# Patient Record
Sex: Male | Born: 1990 | Race: White | Hispanic: No | Marital: Married | Smoking: Former smoker
Health system: Southern US, Community
[De-identification: ages and names within clinical notes are randomized; demographics above are authoritative.]

## PROBLEM LIST (undated history)

## (undated) DIAGNOSIS — J45909 Unspecified asthma, uncomplicated: Secondary | ICD-10-CM

---
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
## 2014-04-15 ENCOUNTER — Ambulatory Visit: Payer: Self-pay | Admitting: General Practice

## 2015-06-29 ENCOUNTER — Ambulatory Visit
Admission: RE | Admit: 2015-06-29 | Discharge: 2015-06-29 | Disposition: A | Discharge status: Home / Self Care | Payer: PRIVATE HEALTH INSURANCE | Source: Ambulatory Visit | Attending: Family Medicine | Admitting: Family Medicine

## 2015-06-29 ENCOUNTER — Other Ambulatory Visit: Payer: Self-pay | Admitting: Family Medicine

## 2015-06-29 DIAGNOSIS — Z09 Encounter for follow-up examination after completed treatment for conditions other than malignant neoplasm: Secondary | ICD-10-CM

## 2015-06-29 DIAGNOSIS — Z569 Unspecified problems related to employment: Secondary | ICD-10-CM | POA: Diagnosis present

## 2015-10-18 ENCOUNTER — Emergency Department
Admission: EM | Admit: 2015-10-18 | Discharge: 2015-10-18 | Disposition: A | Discharge status: Home / Self Care | Payer: Worker's Compensation | Attending: Emergency Medicine | Admitting: Emergency Medicine

## 2015-10-18 ENCOUNTER — Encounter: Payer: Self-pay | Admitting: Emergency Medicine

## 2015-10-18 ENCOUNTER — Emergency Department: Payer: Worker's Compensation

## 2015-10-18 DIAGNOSIS — Y990 Civilian activity done for income or pay: Secondary | ICD-10-CM | POA: Insufficient documentation

## 2015-10-18 DIAGNOSIS — Y929 Unspecified place or not applicable: Secondary | ICD-10-CM | POA: Insufficient documentation

## 2015-10-18 DIAGNOSIS — S20212A Contusion of left front wall of thorax, initial encounter: Secondary | ICD-10-CM

## 2015-10-18 DIAGNOSIS — J45909 Unspecified asthma, uncomplicated: Secondary | ICD-10-CM | POA: Diagnosis not present

## 2015-10-18 DIAGNOSIS — W1839XA Other fall on same level, initial encounter: Secondary | ICD-10-CM | POA: Diagnosis not present

## 2015-10-18 DIAGNOSIS — Y9389 Activity, other specified: Secondary | ICD-10-CM | POA: Insufficient documentation

## 2015-10-18 DIAGNOSIS — S8012XA Contusion of left lower leg, initial encounter: Secondary | ICD-10-CM | POA: Diagnosis not present

## 2015-10-18 DIAGNOSIS — F17210 Nicotine dependence, cigarettes, uncomplicated: Secondary | ICD-10-CM | POA: Diagnosis not present

## 2015-10-18 DIAGNOSIS — S8992XA Unspecified injury of left lower leg, initial encounter: Secondary | ICD-10-CM | POA: Diagnosis present

## 2015-10-18 HISTORY — DX: Unspecified asthma, uncomplicated: J45.909

## 2015-10-18 MED ORDER — IBUPROFEN 800 MG PO TABS: 800.0000 mg | ORAL_TABLET | Freq: Three times a day (TID) | ORAL | Status: AC | PRN

## 2015-10-18 MED ORDER — CYCLOBENZAPRINE HCL 5 MG PO TABS: 5.0000 mg | ORAL_TABLET | Freq: Three times a day (TID) | ORAL | Status: AC | PRN

## 2015-10-18 NOTE — Discharge Instructions (Signed)
Contusion A contusion is a deep bruise. Contusions are the result of a blunt injury to tissues and muscle fibers under the skin. The injury causes bleeding under the skin. The skin overlying the contusion may turn blue, purple, or yellow. Minor injuries will give you a painless contusion, but more severe contusions may stay painful and swollen for a few weeks.  CAUSES  This condition is usually caused by a blow, trauma, or direct force to an area of the body. SYMPTOMS  Symptoms of this condition include:  Swelling of the injured area.  Pain and tenderness in the injured area.  Discoloration. The area may have redness and then turn blue, purple, or yellow. DIAGNOSIS  This condition is diagnosed based on a physical exam and medical history. An X-ray, CT scan, or MRI may be needed to determine if there are any associated injuries, such as broken bones (fractures). TREATMENT  Specific treatment for this condition depends on what area of the body was injured. In general, the best treatment for a contusion is resting, icing, applying pressure to (compression), and elevating the injured area. This is often called the RICE strategy. Over-the-counter anti-inflammatory medicines may also be recommended for pain control.  HOME CARE INSTRUCTIONS   Rest the injured area.  If directed, apply ice to the injured area:  Put ice in a plastic bag.  Place a towel between your skin and the bag.  Leave the ice on for 20 minutes, 2-3 times per day.  If directed, apply light compression to the injured area using an elastic bandage. Make sure the bandage is not wrapped too tightly. Remove and reapply the bandage as directed by your health care provider.  If possible, raise (elevate) the injured area above the level of your heart while you are sitting or lying down.  Take over-the-counter and prescription medicines only as told by your health care provider. SEEK MEDICAL CARE IF:  Your symptoms do not  improve after several days of treatment.  Your symptoms get worse.  You have difficulty moving the injured area. SEEK IMMEDIATE MEDICAL CARE IF:   You have severe pain.  You have numbness in a hand or foot.  Your hand or foot turns pale or cold.   This information is not intended to replace advice given to you by your health care provider. Make sure you discuss any questions you have with your health care provider.   Document Released: 02/16/2005 Document Revised: 01/28/2015 Document Reviewed: 09/24/2014 Elsevier Interactive Patient Education 2016 Elsevier Inc.  Rib Contusion A rib contusion is a deep bruise on your rib area. Contusions are the result of a blunt trauma that causes bleeding and injury to the tissues under the skin. A rib contusion may involve bruising of the ribs and of the skin and muscles in the area. The skin overlying the contusion may turn blue, purple, or yellow. Minor injuries will give you a painless contusion, but more severe contusions may stay painful and swollen for a few weeks. CAUSES  A contusion is usually caused by a blow, trauma, or direct force to an area of the body. This often occurs while playing contact sports. SYMPTOMS  Swelling and redness of the injured area.  Discoloration of the injured area.  Tenderness and soreness of the injured area.  Pain with or without movement. DIAGNOSIS  The diagnosis can be made by taking a medical history and performing a physical exam. An X-ray, CT scan, or MRI may be needed to determine if  there were any associated injuries, such as broken bones (fractures) or internal injuries. TREATMENT  Often, the best treatment for a rib contusion is rest. Icing or applying cold compresses to the injured area may help reduce swelling and inflammation. Deep breathing exercises may be recommended to reduce the risk of partial lung collapse and pneumonia. Over-the-counter or prescription medicines may also be recommended for  pain control. HOME CARE INSTRUCTIONS   Apply ice to the injured area:  Put ice in a plastic bag.  Place a towel between your skin and the bag.  Leave the ice on for 20 minutes, 2-3 times per day.  Take medicines only as directed by your health care provider.  Rest the injured area. Avoid strenuous activity and any activities or movements that cause pain. Be careful during activities and avoid bumping the injured area.  Perform deep-breathing exercises as directed by your health care provider.  Do not lift anything that is heavier than 5 lb (2.3 kg) until your health care provider approves.  Do not use any tobacco products, including cigarettes, chewing tobacco, or electronic cigarettes. If you need help quitting, ask your health care provider. SEEK MEDICAL CARE IF:   You have increased bruising or swelling.  You have pain that is not controlled with treatment.  You have a fever. SEEK IMMEDIATE MEDICAL CARE IF:   You have difficulty breathing or shortness of breath.  You develop a continual cough, or you cough up thick or bloody sputum.  You feel sick to your stomach (nauseous), you throw up (vomit), or you have abdominal pain.   This information is not intended to replace advice given to you by your health care provider. Make sure you discuss any questions you have with your health care provider.   Document Released: 02/01/2001 Document Revised: 05/30/2014 Document Reviewed: 02/18/2014 Elsevier Interactive Patient Education [employer] Elsevier Inc.

## 2015-10-18 NOTE — ED Provider Notes (Signed)
[facility] Emergency Department Provider Note  ____________________________________________  Time seen: Approximately 3:19 PM  I have reviewed the triage vital signs and the nursing notes.   HISTORY  Chief Complaint Work Related Injury and Leg Pain    HPI [place] D Sagan is a 25 y.o. male presents for evaluation of left leg and left side pain. Patient states a "60 pound mold" fell on him at work. Patient was sent over by his work for evaluation. No relief with Tylenol or ibuprofen over-the-counter. He is concerned about the bruising on both his ribs and his leg. Describes pain as 6/10 at this time.   Past Medical History  Diagnosis Date  . Asthma     There are no active problems to display for this patient.   History reviewed. No pertinent past surgical history.  Current Outpatient Rx  Name  Route  Sig  Dispense  Refill  . cyclobenzaprine (FLEXERIL) 5 MG tablet   Oral   Take 1 tablet (5 mg total) by mouth every 8 (eight) hours as needed for muscle spasms.   30 tablet   0   . ibuprofen (ADVIL,MOTRIN) 800 MG tablet   Oral   Take 1 tablet (800 mg total) by mouth every 8 (eight) hours as needed.   30 tablet   0     Allergies Review of patient's allergies indicates no known allergies.  No family history on file.  Social History Social History  Substance Use Topics  . Smoking status: Current Every Day Smoker    Types: Cigarettes  . Smokeless tobacco: None  . Alcohol Use: Yes     Comment: occasionally    Review of Systems Constitutional: No fever/chills Cardiovascular: Denies chest pain. Respiratory: Denies shortness of breath. Gastrointestinal: No abdominal pain.  No nausea, no vomiting.  No diarrhea.  No constipation. Musculoskeletal: Positive for left leg pain. Positive for left rib pain. Skin: Negative for rash. Neurological: Negative for headaches, focal weakness or numbness.  10-point ROS otherwise  negative.  ____________________________________________   PHYSICAL EXAM:  VITAL SIGNS: ED Triage Vitals  Enc Vitals Group     BP 10/18/15 1458 106/65 mmHg     Pulse Rate 10/18/15 1458 79     Resp 10/18/15 1458 18     Temp 10/18/15 1458 98.3 F (36.8 C)     Temp Source 10/18/15 1458 Oral     SpO2 10/18/15 1458 98 %     Weight 10/18/15 1458 150 lb (68.04 kg)     Height 10/18/15 1458 5\' 8"  (1.727 m)     Head Cir --      Peak Flow --      Pain Score 10/18/15 1459 6     Pain Loc --      Pain Edu? --      Excl. in GC? --     Constitutional: Alert and oriented. Well appearing and in no acute distress.   Cardiovascular: Normal rate, regular rhythm. Grossly normal heart sounds.  Good peripheral circulation. Respiratory: Normal respiratory effort.  No retractions. Lungs CTAB. Gastrointestinal: Soft and nontender. No distention. No abdominal bruits. No CVA tenderness. Musculoskeletal: Left rib cage lower lateral aspect with ecchymosis and bruising point tenderness noted. Left posterior tib-fib in the gastrocnemius muscle area with ecchymosis and bruising as well. Neurologic:  Normal speech and language. No gross focal neurologic deficits are appreciated. Ambulates with a limp. Skin:  Skin is warm, dry and intact. No rash noted. Psychiatric: Mood and affect are  normal. Speech and behavior are normal.  ____________________________________________   LABS (all labs ordered are listed, but only abnormal results are displayed)  Labs Reviewed - No data to display ____________________________________________  EKG   ____________________________________________  RADIOLOGY  No acute osseous findings ____________________________________________   PROCEDURES  Procedure(s) performed: None  Critical Care performed: No  ____________________________________________   INITIAL IMPRESSION / ASSESSMENT AND PLAN / ED COURSE  Pertinent labs & imaging results that were available during  my care of the patient were reviewed by me and considered in my medical decision making (see chart for details).  Acute left rib contusion. Acute left lower leg contusion. Reassurance provided to the patient Rx given for Flexeril 5 mg 3 times a day ibuprofen 800 mg 3 times a day. Follow-up with orthopedics primary care return to ER with any worsening symptomology. ____________________________________________   FINAL CLINICAL IMPRESSION(S) / ED DIAGNOSES  Final diagnoses:  Rib contusion, left, initial encounter  Contusion of leg, left, initial encounter     This chart was dictated using voice recognition software/Dragon. Despite best efforts to proofread, errors can occur which can change the meaning. Any change was purely unintentional.   Evangeline Dakin, PA-C 10/18/15 1605  Minna Antis, MD 10/19/15 [phone]

## 2015-10-18 NOTE — ED Notes (Addendum)
Patient presents to the ED with left leg pain and left side pain.  Patient states a 60lb "mold" fell on him at work.  Patient states he went to his job's health clinic and was instructed to come to the ED.  Patient is in no obvious distress at this time.  Patient reports pain bearing weight on left leg.

## 2015-10-18 NOTE — ED Notes (Signed)
Spoke with Haskell FlirtKiersten Fitzgerald, the owner of the patient's company, and spoke with her in regards to whether or not workers comp needs to be filed considering the date of injury was Wednesday 10/14/15.  [facility] to [facility], the patient can still file the workers comp claim but only a urine drug screen needs to be completed.  She declined the breathalyzer to be done on the patient. Penni HomansKiersten can be reached at [phone].

## 2016-05-28 ENCOUNTER — Encounter: Payer: Self-pay | Admitting: Emergency Medicine

## 2016-05-28 ENCOUNTER — Emergency Department
Admission: EM | Admit: 2016-05-28 | Discharge: 2016-05-28 | Disposition: A | Discharge status: Home / Self Care | Payer: PRIVATE HEALTH INSURANCE | Attending: Emergency Medicine | Admitting: Emergency Medicine

## 2016-05-28 DIAGNOSIS — Z87891 Personal history of nicotine dependence: Secondary | ICD-10-CM | POA: Insufficient documentation

## 2016-05-28 DIAGNOSIS — J0120 Acute ethmoidal sinusitis, unspecified: Secondary | ICD-10-CM | POA: Insufficient documentation

## 2016-05-28 DIAGNOSIS — J300 Vasomotor rhinitis: Secondary | ICD-10-CM

## 2016-05-28 MED ORDER — FLUTICASONE PROPIONATE 50 MCG/ACT NA SUSP: 2.0000 | Freq: Every day | NASAL | 0 refills | Status: AC

## 2016-05-28 MED ORDER — BENZONATATE 100 MG PO CAPS: 100.0000 mg | ORAL_CAPSULE | Freq: Three times a day (TID) | ORAL | 0 refills | Status: AC | PRN

## 2016-05-28 NOTE — Discharge Instructions (Signed)
Use the nasal steroid as directed. Start the cough medicine as needed. Consider starting and OTC allergy medicine + pseudoephedrine (Allegra-D/Claritin-D/Zyrtec-D). Use a room humidifier and nasal saline mist to moisturize your sinuses. [facility].

## 2016-05-28 NOTE — ED Triage Notes (Signed)
Sinus pressure and pain since yesterday  

## 2016-05-28 NOTE — ED Provider Notes (Signed)
[facility] Emergency Department Provider Note ____________________________________________  Time seen: 1021  I have reviewed the triage vital signs and the nursing notes.  HISTORY  Chief Complaint  Facial Pain  HPI Jeffery Delgado is a 26 y.o. male sensor the ED with a one day complaint of sinus pressure and pain. He reports his symptoms are primarily on the left side of his face around his eye. He reports some stuffy nose as well as some sinus headache. He denies any bloody nasal discharge or any history of seasonal allergies. He is taken a dose of DayQuil without any significant change in his symptoms. He denies any fevers, chills, or sweats.  Past Medical History:  Diagnosis Date  . Asthma     There are no active problems to display for this patient.   History reviewed. No pertinent surgical history.  Prior to Admission medications   Medication Sig Start Date End Date Taking? Authorizing Provider  benzonatate (TESSALON PERLES) 100 MG capsule Take 1 capsule (100 mg total) by mouth 3 (three) times daily as needed for cough (Take 1-2 per dose). 05/28/16   Quinley Nesler V Bacon Carlitos Bottino, PA-C  cyclobenzaprine (FLEXERIL) 5 MG tablet Take 1 tablet (5 mg total) by mouth every 8 (eight) hours as needed for muscle spasms. 10/18/15   Charmayne Sheer Beers, PA-C  fluticasone (FLONASE) 50 MCG/ACT nasal spray Place 2 sprays into both nostrils daily. 05/28/16   Cherie Lasalle V Bacon Laquonda Welby, PA-C  ibuprofen (ADVIL,MOTRIN) 800 MG tablet Take 1 tablet (800 mg total) by mouth every 8 (eight) hours as needed. 10/18/15   Evangeline Dakin, PA-C   Allergies Patient has no known allergies.  No family history on file.  Social History Social History  Substance Use Topics  . Smoking status: Former Smoker    Types: Cigarettes  . Smokeless tobacco: Not on file  . Alcohol use Yes     Comment: occasionally    Review of Systems  Constitutional: Negative for fever. Eyes: Negative for visual  changes. ENT: Negative for sore throat. Reports sinus pressure on the left as above. Cardiovascular: Negative for chest pain. Respiratory: Negative for shortness of breath. Neurological: Negative for headaches, focal weakness or numbness. ____________________________________________  PHYSICAL EXAM:  VITAL SIGNS: ED Triage Vitals  Enc Vitals Group     BP 05/28/16 0852 (!) 121/100     Pulse Rate 05/28/16 0852 75     Resp 05/28/16 0852 18     Temp 05/28/16 0852 97.9 F (36.6 C)     Temp Source 05/28/16 0852 Oral     SpO2 05/28/16 0852 100 %     Weight 05/28/16 0854 145 lb (65.8 kg)     Height 05/28/16 0854 5\' 7"  (1.702 m)     Head Circumference --      Peak Flow --      Pain Score 05/28/16 0854 5     Pain Loc --      Pain Edu? --      Excl. in GC? --    Constitutional: Alert and oriented. Well appearing and in no distress. Head: Normocephalic and atraumatic. Eyes: Conjunctivae are normal. PERRL. Normal extraocular movements Ears: Canals clear. TMs intact bilaterally. Nose: No congestion/rhinorrhea/epistaxis. Turbinates are enlarged slightly reddened, but moist. Mouth/Throat: Mucous membranes are moist. Lids midline and tonsils are flat. Neck: Supple. Thyroid is palpable and non-tender. Hematological/Lymphatic/Immunological: No cervical lymphadenopathy. Cardiovascular: Normal rate, regular rhythm. Normal distal pulses. Respiratory: Normal respiratory effort. No wheezes/rales/rhonchi. Skin:  Skin is  warm, dry and intact. No rash noted. ____________________________________________  INITIAL IMPRESSION / ASSESSMENT AND PLAN / ED COURSE  Patient with what appears to be in acute suspect more sinusitis with vasomotor rhinitis. No indication at this time of an acute infectious process. He will be discharged with prescriptions for Flonase, and Tessalon Perles. He is also advised on over-the-counter allergy medicine plus pseudoephedrine. He is encouraged to use saline mist clear the  sinuses and to consider these other review of eye. He should [facility] with [facility] for ongoing symptom management.  Clinical Course    ____________________________________________  FINAL CLINICAL IMPRESSION(S) / ED DIAGNOSES  Final diagnoses:  Acute non-recurrent ethmoidal sinusitis  Acute vasomotor rhinitis      Lissa HoardJenise V Bacon Elza Sortor, PA-C 05/28/16 1157    Jene Everyobert Kinner, MD 05/28/16 1218

## 2018-01-26 IMAGING — CR DG CHEST 2V
1 series · 2 of 2 positions shown · non-contrast
Comparison: 04/15/2014

CLINICAL DATA: Annual chest x-ray for industrial hygiene, fiber
exposure, health on wall S

EXAM:
CHEST  2 VIEW

[Series 1: dg chest 2 view · 0.14mm/px · 2 of 2 slices shown]
[im 1/2]
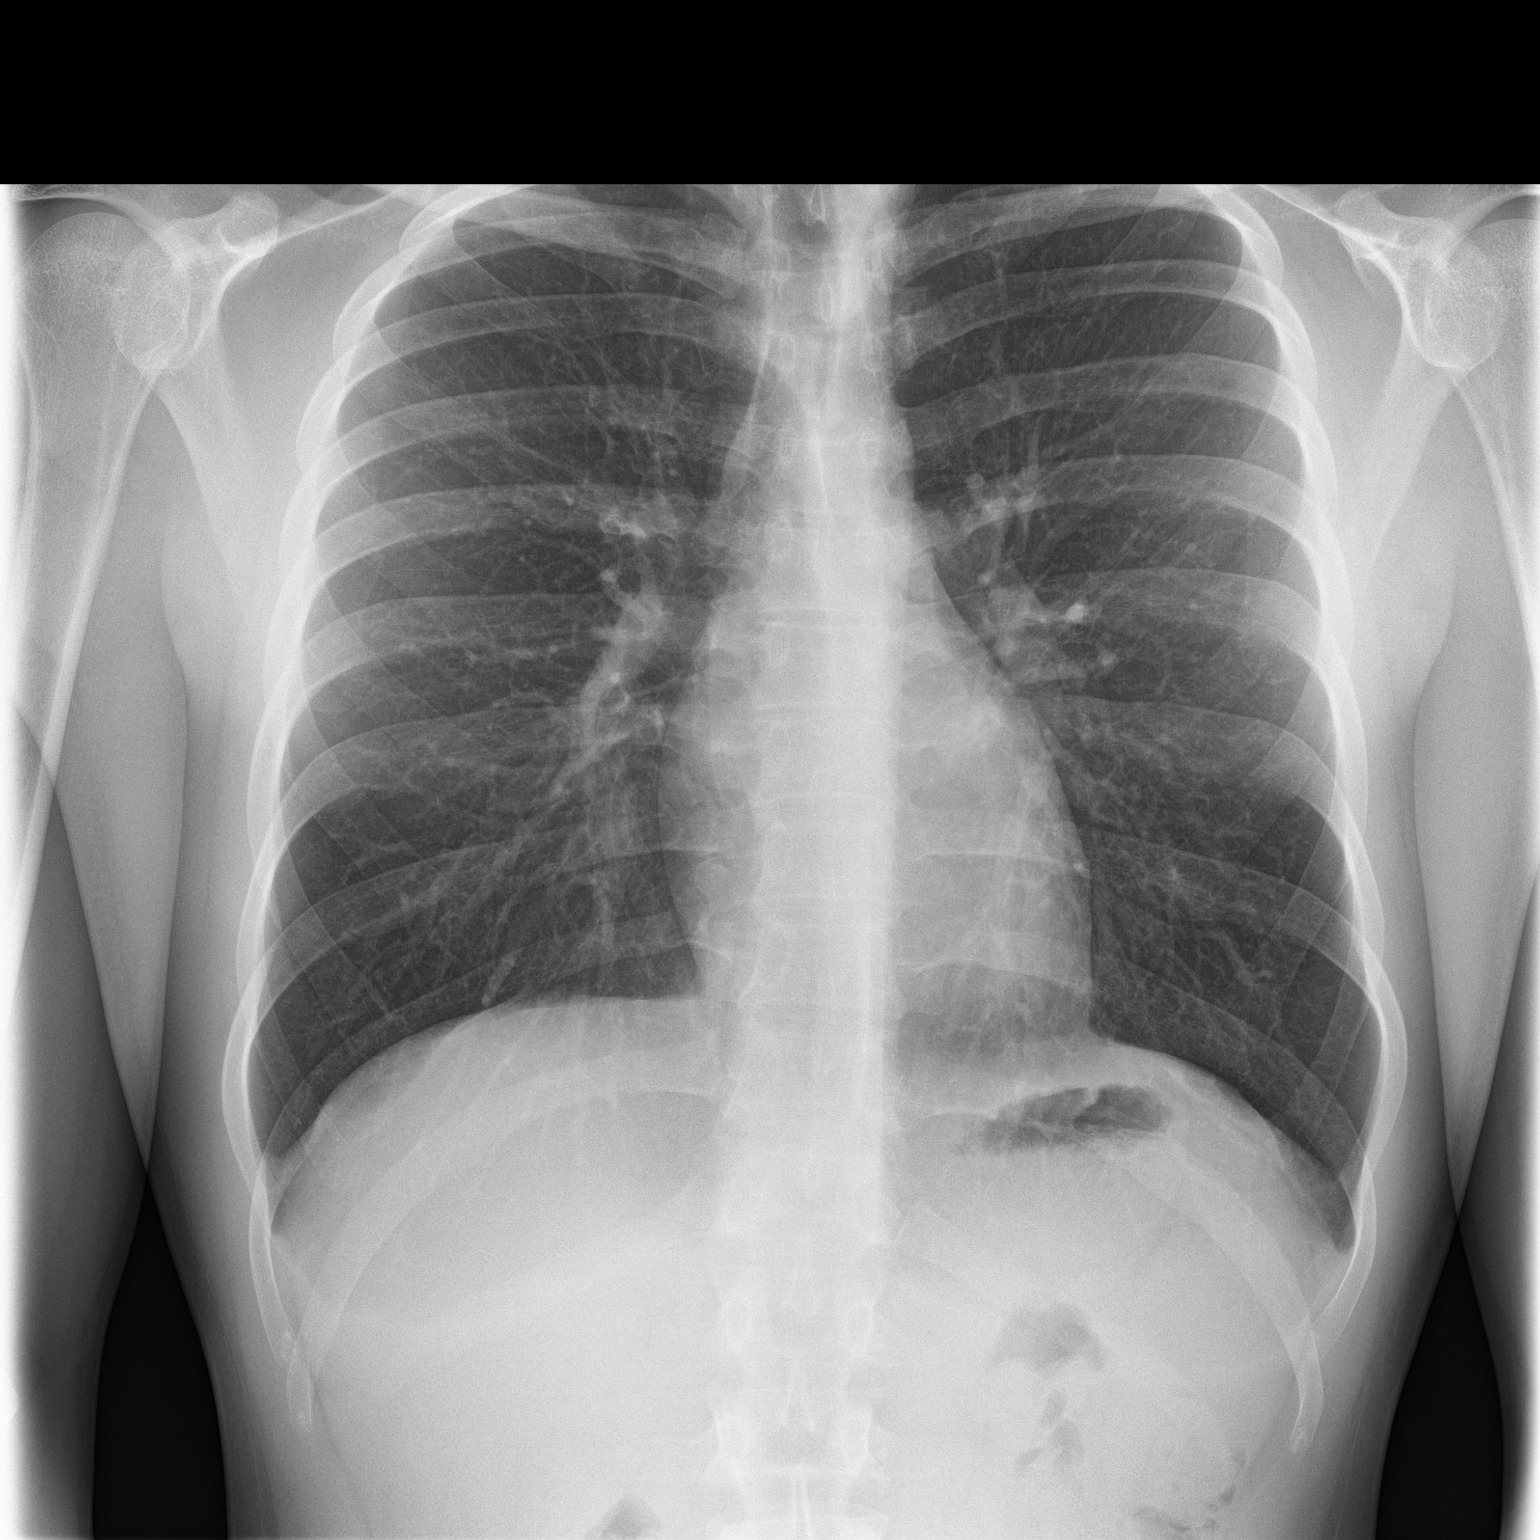
[im 2/2]
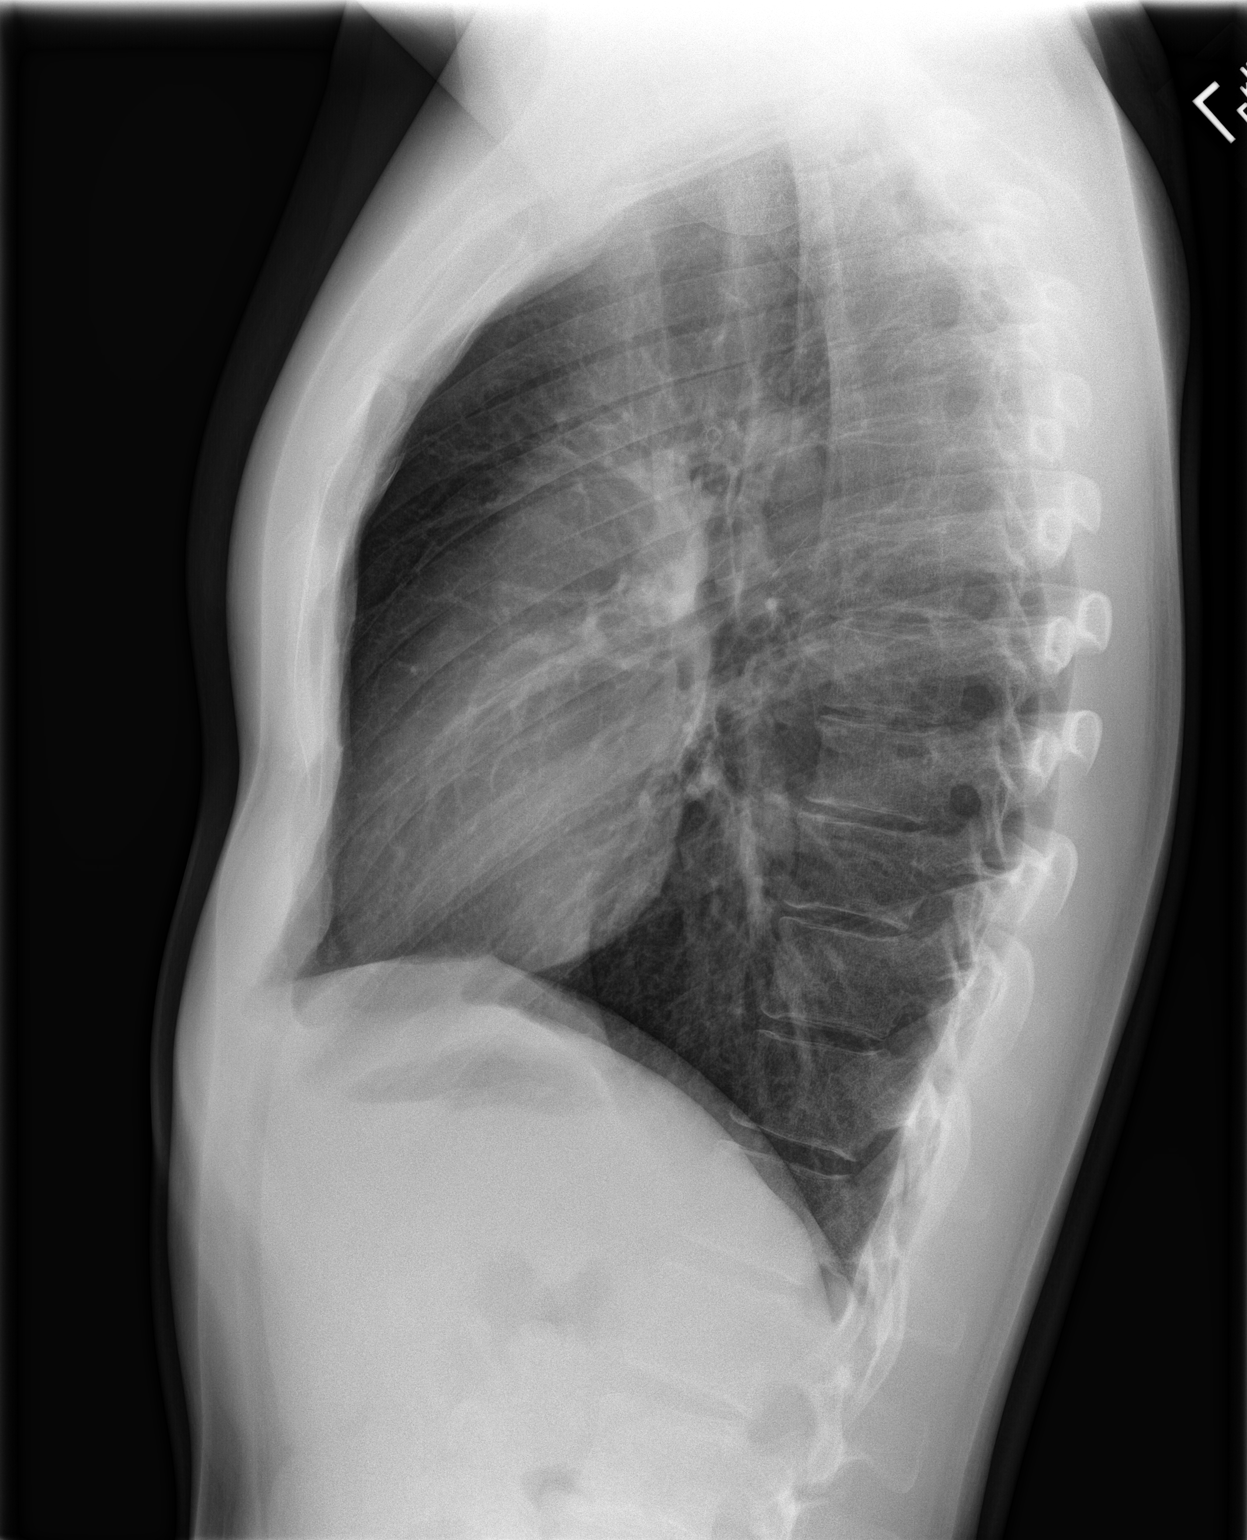

[2 of 2 positions shown; findings below may reference images not displayed]

FINDINGS: Normal heart size, mediastinal contours, and pulmonary vascularity.

Lungs minimally hyperinflated but clear.

No pleural effusion or pneumothorax.

Bones unremarkable.
IMPRESSION: No acute abnormalities.

## 2018-05-17 IMAGING — CR DG RIBS W/ CHEST 3+V*L*
1 series · 3 of 3 positions shown · non-contrast
Comparison: Chest radiograph 06/29/2015

CLINICAL DATA: Initial encounter for Pt had a 60lb object fall on
him at work on 10-14-2015. Pt is complaining of pain on left
posterior lower ribs-BB marks area of interest. Pt is also
comlaining of lower leg pain-mostly distal. Shielded. Smoker. Hx of
asthma

EXAM:
LEFT RIBS AND CHEST - 3+ VIEW

[Series 1: dg ribs unilateral w/chest left · 0.14mm/px · 3 of 3 slices shown]
[im 1/3]
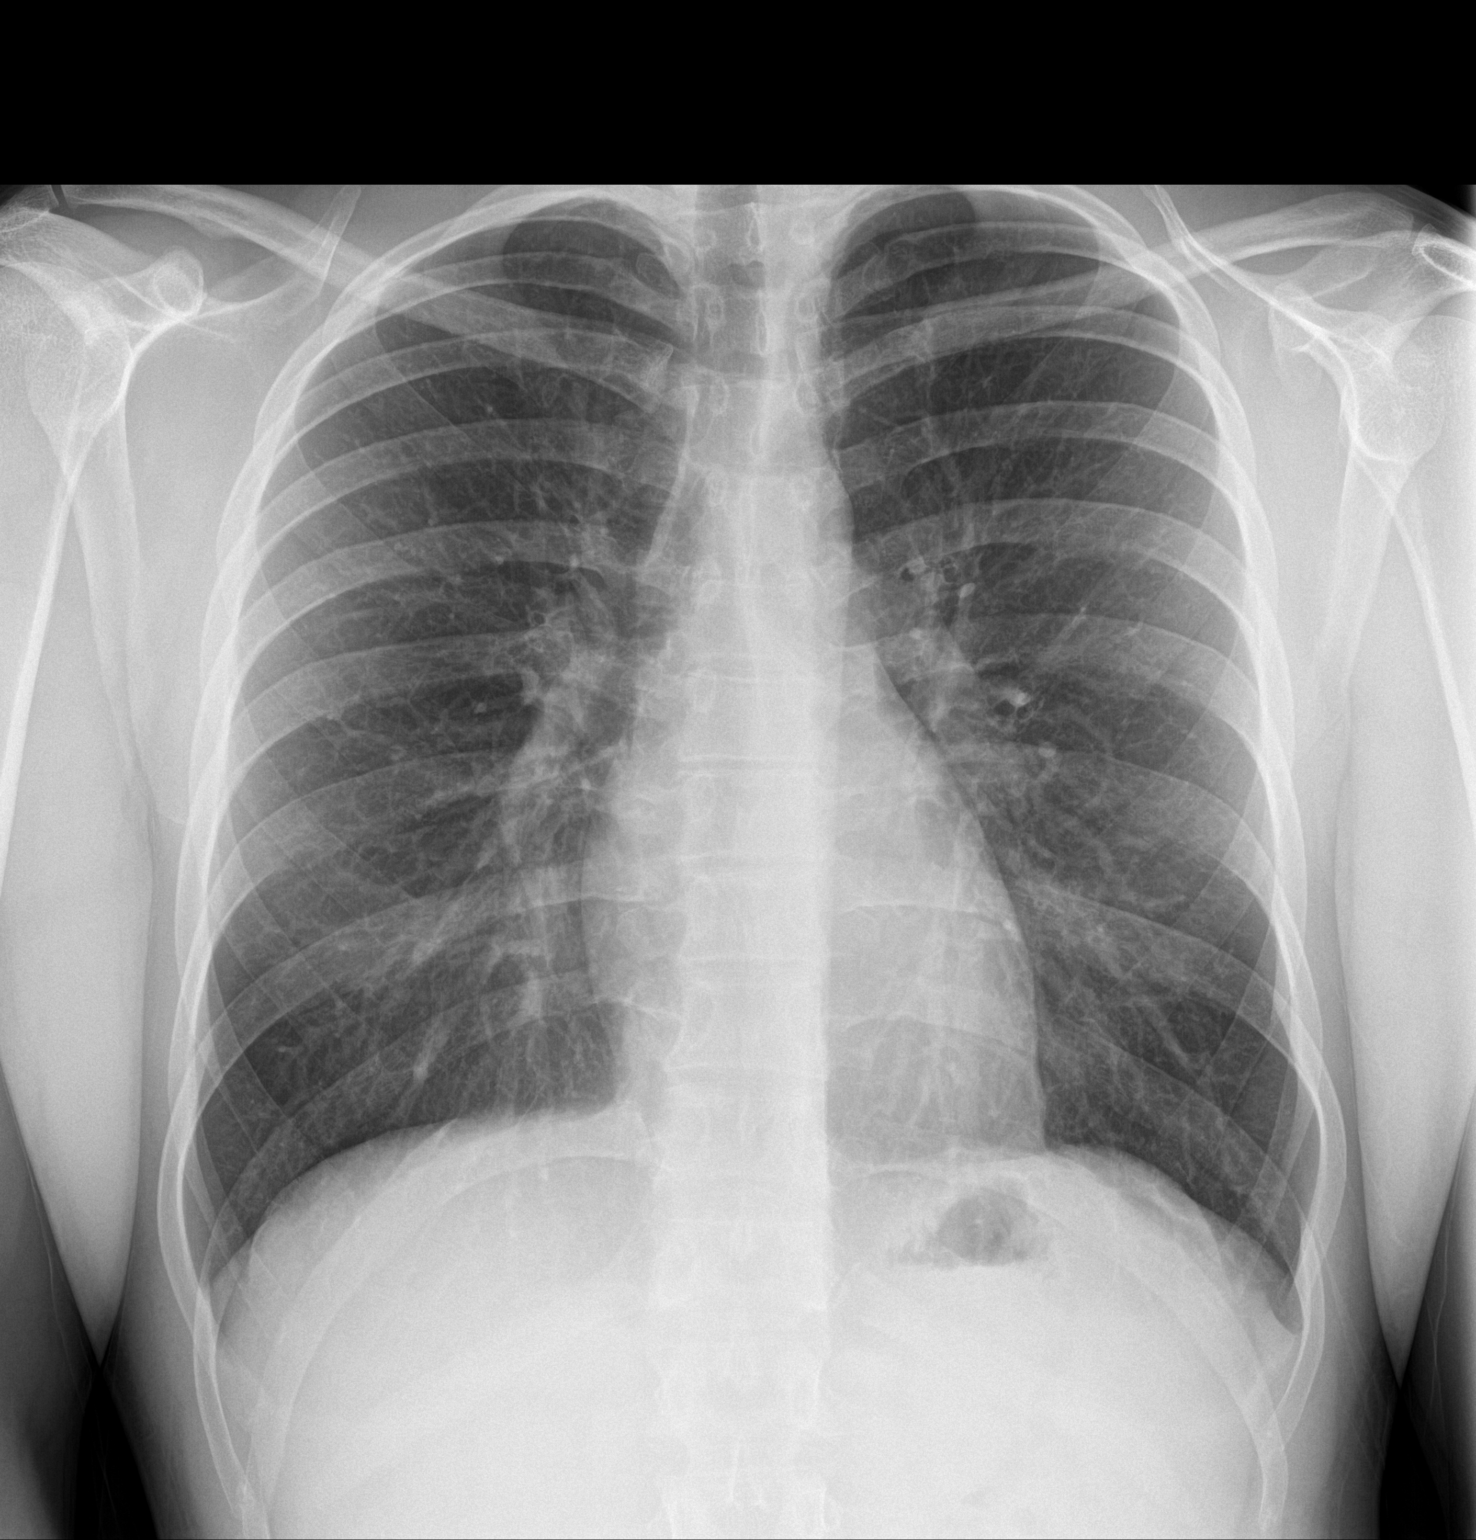
[im 2/3]
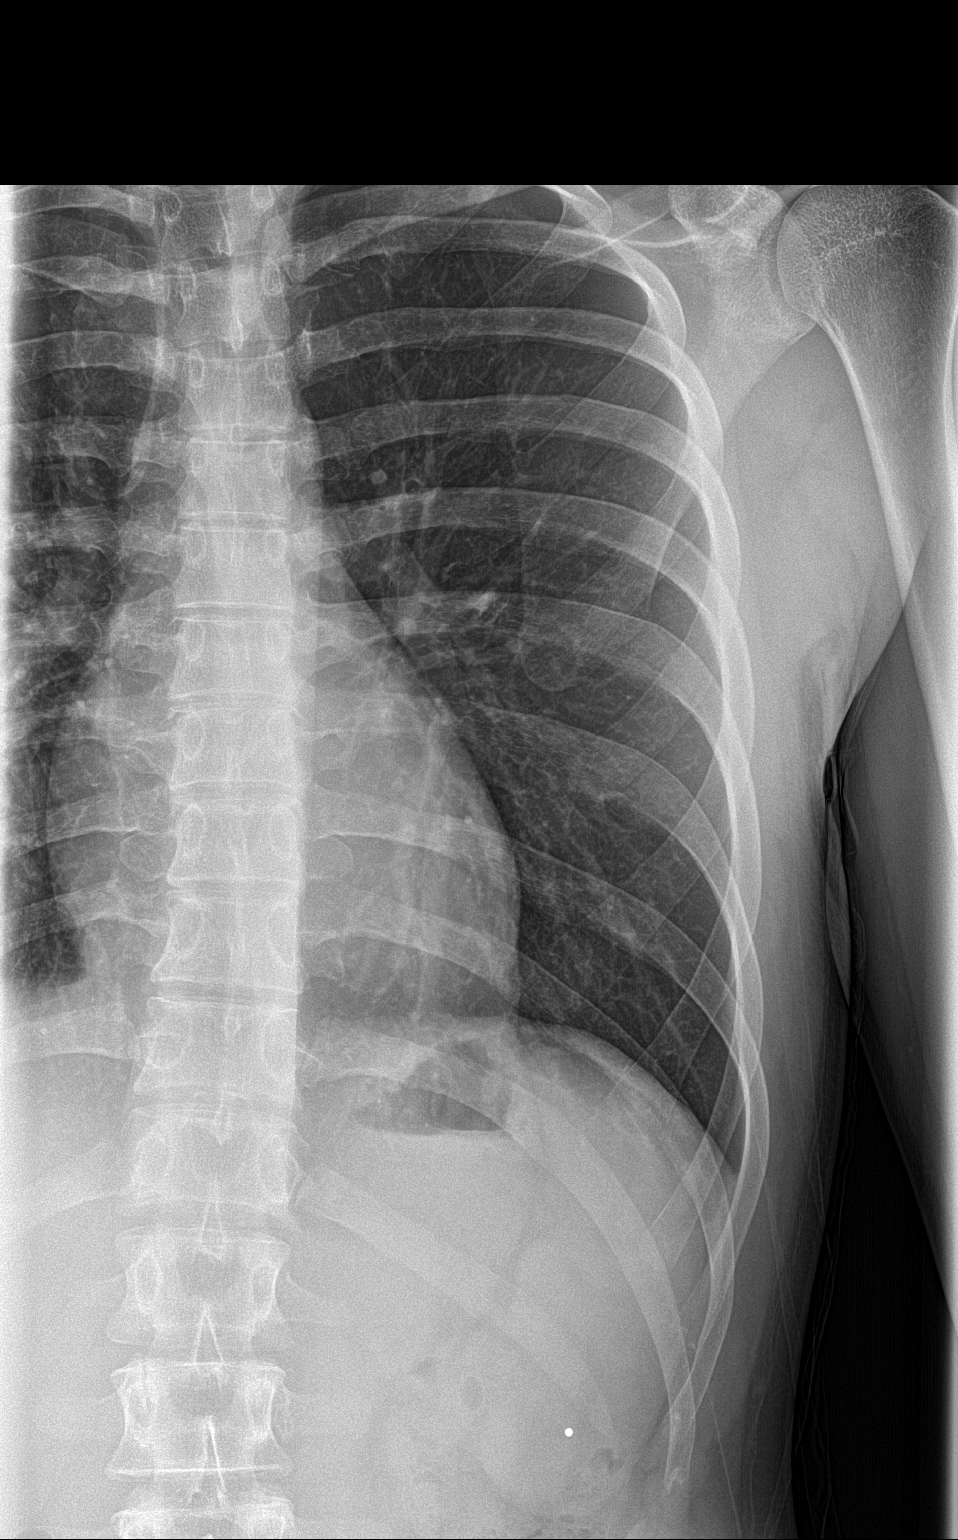
[im 3/3]
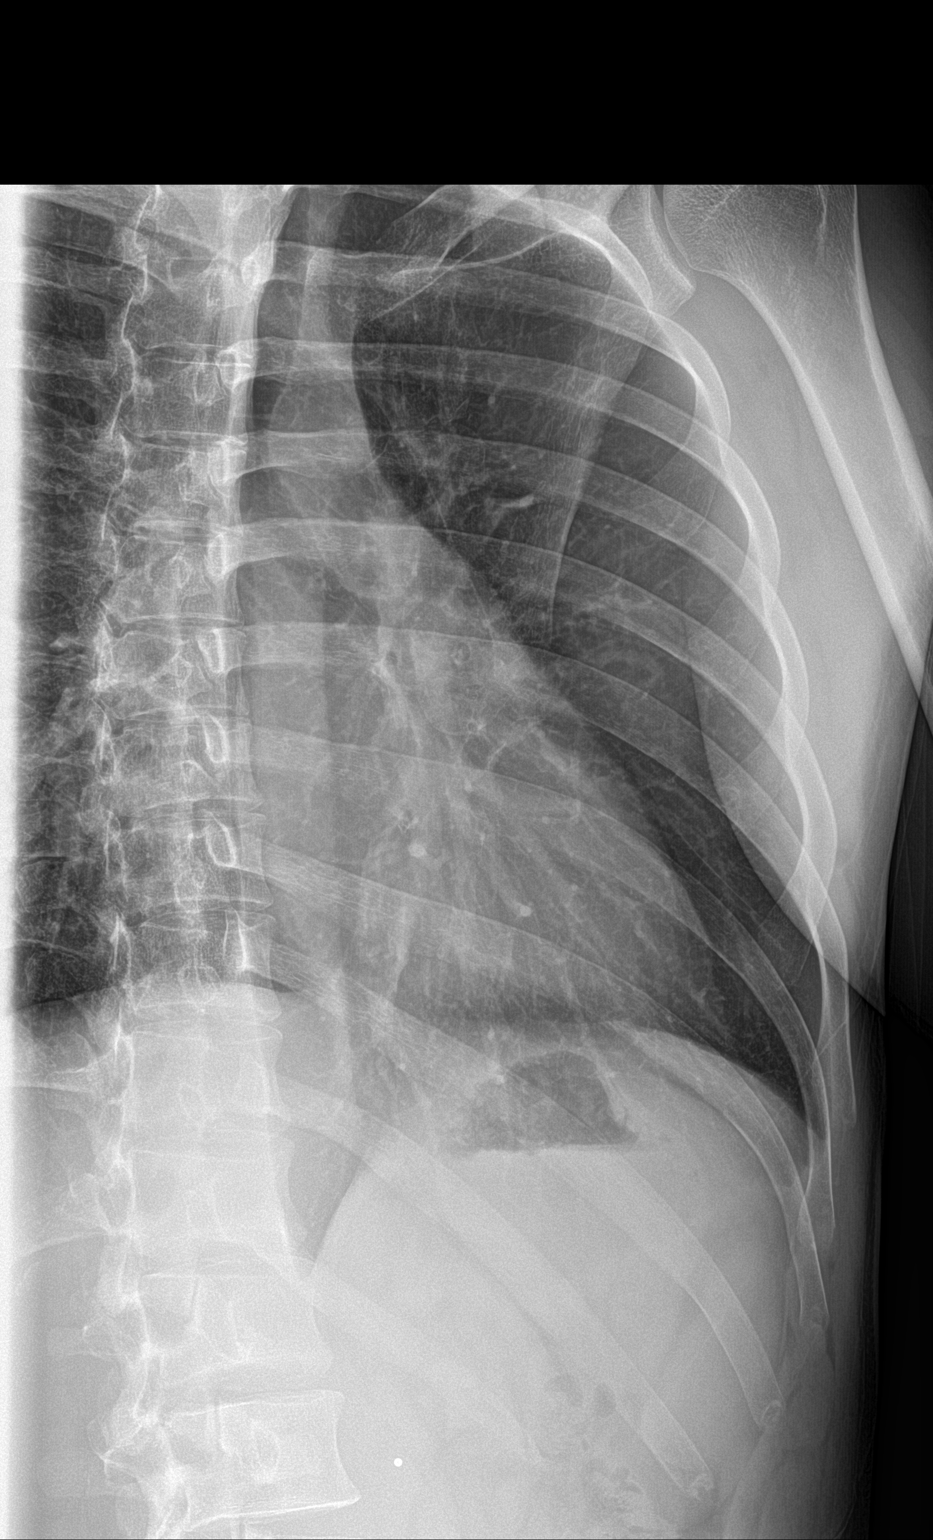

[3 of 3 positions shown; findings below may reference images not displayed]

FINDINGS: Frontal view of the chest and two views of left-sided ribs. Frontal
view of the chest demonstrates midline trachea. Normal heart size
and mediastinal contours. No pleural effusion or pneumothorax. Clear
lungs.

Two views of left-sided ribs demonstrate a radiographic marker over
the lateral aspect of the twelfth left rib. No underlying displaced
rib fracture.
IMPRESSION: No displaced rib fracture, pneumothorax, or other acute finding.
